# Patient Record
Sex: Female | Born: 1945 | Race: Black or African American | Hispanic: No | Marital: Single | Smoking: Former smoker
Health system: Southern US, Community
[De-identification: ages and names within clinical notes are randomized; demographics above are authoritative.]

## PROBLEM LIST (undated history)

## (undated) DIAGNOSIS — Z9221 Personal history of antineoplastic chemotherapy: Secondary | ICD-10-CM

## (undated) DIAGNOSIS — C801 Malignant (primary) neoplasm, unspecified: Secondary | ICD-10-CM

## (undated) DIAGNOSIS — I10 Essential (primary) hypertension: Secondary | ICD-10-CM

## (undated) DIAGNOSIS — Z923 Personal history of irradiation: Secondary | ICD-10-CM

## (undated) HISTORY — PX: MASTECTOMY: SHX3

## (undated) HISTORY — PX: ABDOMINAL HYSTERECTOMY: SHX81

---

## 2004-11-06 ENCOUNTER — Other Ambulatory Visit: Payer: Self-pay

## 2004-11-12 ENCOUNTER — Ambulatory Visit: Payer: Self-pay | Admitting: General Surgery

## 2004-12-02 ENCOUNTER — Ambulatory Visit: Payer: Self-pay | Admitting: Internal Medicine

## 2004-12-17 ENCOUNTER — Ambulatory Visit: Payer: Self-pay | Admitting: General Surgery

## 2004-12-21 ENCOUNTER — Ambulatory Visit: Payer: Self-pay | Admitting: Internal Medicine

## 2005-01-20 ENCOUNTER — Ambulatory Visit: Payer: Self-pay | Admitting: Internal Medicine

## 2005-02-20 ENCOUNTER — Ambulatory Visit: Payer: Self-pay | Admitting: Internal Medicine

## 2005-03-22 ENCOUNTER — Ambulatory Visit: Payer: Self-pay | Admitting: Internal Medicine

## 2005-04-22 ENCOUNTER — Ambulatory Visit: Payer: Self-pay | Admitting: Internal Medicine

## 2005-05-23 ENCOUNTER — Ambulatory Visit: Payer: Self-pay | Admitting: Internal Medicine

## 2005-06-22 ENCOUNTER — Ambulatory Visit: Payer: Self-pay | Admitting: Internal Medicine

## 2005-07-23 ENCOUNTER — Ambulatory Visit: Payer: Self-pay | Admitting: Internal Medicine

## 2005-08-01 ENCOUNTER — Ambulatory Visit: Payer: Self-pay | Admitting: General Surgery

## 2005-08-22 ENCOUNTER — Ambulatory Visit: Payer: Self-pay | Admitting: Internal Medicine

## 2005-09-22 ENCOUNTER — Ambulatory Visit: Payer: Self-pay | Admitting: Internal Medicine

## 2005-10-23 ENCOUNTER — Ambulatory Visit: Payer: Self-pay | Admitting: Internal Medicine

## 2005-11-20 ENCOUNTER — Ambulatory Visit: Payer: Self-pay | Admitting: Internal Medicine

## 2006-02-19 ENCOUNTER — Ambulatory Visit: Payer: Self-pay | Admitting: Internal Medicine

## 2006-02-20 ENCOUNTER — Ambulatory Visit: Payer: Self-pay | Admitting: Internal Medicine

## 2006-03-22 ENCOUNTER — Ambulatory Visit: Payer: Self-pay | Admitting: Internal Medicine

## 2006-03-30 ENCOUNTER — Ambulatory Visit: Payer: Self-pay | Admitting: Internal Medicine

## 2006-04-22 ENCOUNTER — Ambulatory Visit: Payer: Self-pay | Admitting: Internal Medicine

## 2006-05-23 ENCOUNTER — Ambulatory Visit: Payer: Self-pay | Admitting: Internal Medicine

## 2006-06-23 ENCOUNTER — Ambulatory Visit: Payer: Self-pay | Admitting: Internal Medicine

## 2006-07-23 ENCOUNTER — Ambulatory Visit: Payer: Self-pay | Admitting: Internal Medicine

## 2006-08-26 ENCOUNTER — Emergency Department: Payer: Self-pay | Admitting: Emergency Medicine

## 2006-09-28 ENCOUNTER — Emergency Department: Payer: Self-pay | Admitting: Internal Medicine

## 2006-10-08 ENCOUNTER — Ambulatory Visit: Payer: Self-pay | Admitting: Family Medicine

## 2006-10-21 ENCOUNTER — Ambulatory Visit: Payer: Self-pay | Admitting: Internal Medicine

## 2006-10-23 ENCOUNTER — Ambulatory Visit: Payer: Self-pay | Admitting: Internal Medicine

## 2007-01-21 ENCOUNTER — Ambulatory Visit: Payer: Self-pay | Admitting: Internal Medicine

## 2007-02-16 ENCOUNTER — Ambulatory Visit: Payer: Self-pay | Admitting: Internal Medicine

## 2007-02-21 ENCOUNTER — Ambulatory Visit: Payer: Self-pay | Admitting: Internal Medicine

## 2007-05-04 ENCOUNTER — Ambulatory Visit: Payer: Self-pay

## 2007-05-24 ENCOUNTER — Ambulatory Visit: Payer: Self-pay | Admitting: Internal Medicine

## 2007-06-16 ENCOUNTER — Ambulatory Visit: Payer: Self-pay | Admitting: Internal Medicine

## 2007-06-23 ENCOUNTER — Ambulatory Visit: Payer: Self-pay | Admitting: Internal Medicine

## 2007-09-23 ENCOUNTER — Ambulatory Visit: Payer: Self-pay | Admitting: Internal Medicine

## 2007-10-07 ENCOUNTER — Ambulatory Visit: Payer: Self-pay | Admitting: Internal Medicine

## 2007-10-19 ENCOUNTER — Ambulatory Visit: Payer: Self-pay | Admitting: Family Medicine

## 2007-10-24 ENCOUNTER — Ambulatory Visit: Payer: Self-pay | Admitting: Internal Medicine

## 2007-12-11 ENCOUNTER — Emergency Department: Payer: Self-pay | Admitting: Emergency Medicine
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
## 2008-03-22 ENCOUNTER — Ambulatory Visit: Payer: Self-pay | Admitting: Internal Medicine

## 2008-04-22 ENCOUNTER — Ambulatory Visit: Payer: Self-pay | Admitting: Internal Medicine

## 2008-08-01 ENCOUNTER — Ambulatory Visit: Payer: Self-pay

## 2008-09-22 ENCOUNTER — Ambulatory Visit: Payer: Self-pay | Admitting: Internal Medicine

## 2008-09-28 ENCOUNTER — Ambulatory Visit: Payer: Self-pay

## 2008-10-23 ENCOUNTER — Ambulatory Visit: Payer: Self-pay | Admitting: Internal Medicine

## 2008-12-21 ENCOUNTER — Ambulatory Visit: Payer: Self-pay | Admitting: Internal Medicine

## 2009-01-20 ENCOUNTER — Ambulatory Visit: Payer: Self-pay | Admitting: Internal Medicine

## 2009-03-19 ENCOUNTER — Ambulatory Visit: Payer: Self-pay | Admitting: Internal Medicine

## 2009-03-19 ENCOUNTER — Ambulatory Visit: Payer: Self-pay

## 2009-03-22 ENCOUNTER — Ambulatory Visit: Payer: Self-pay | Admitting: Internal Medicine

## 2009-03-22 ENCOUNTER — Ambulatory Visit: Payer: Self-pay

## 2009-04-05 ENCOUNTER — Ambulatory Visit: Payer: Self-pay | Admitting: General Surgery

## 2009-04-09 ENCOUNTER — Ambulatory Visit: Payer: Self-pay | Admitting: General Surgery

## 2009-04-22 ENCOUNTER — Ambulatory Visit: Payer: Self-pay | Admitting: Internal Medicine

## 2009-05-08 ENCOUNTER — Ambulatory Visit: Payer: Self-pay | Admitting: General Surgery

## 2009-05-17 ENCOUNTER — Ambulatory Visit: Payer: Self-pay | Admitting: Internal Medicine

## 2009-05-20 ENCOUNTER — Emergency Department: Payer: Self-pay | Admitting: Emergency Medicine

## 2009-05-23 ENCOUNTER — Ambulatory Visit: Payer: Self-pay | Admitting: Internal Medicine

## 2009-06-22 ENCOUNTER — Ambulatory Visit: Payer: Self-pay | Admitting: Internal Medicine

## 2009-07-23 ENCOUNTER — Ambulatory Visit: Payer: Self-pay | Admitting: Internal Medicine

## 2009-08-22 ENCOUNTER — Ambulatory Visit: Payer: Self-pay | Admitting: Internal Medicine

## 2009-09-22 ENCOUNTER — Ambulatory Visit: Payer: Self-pay | Admitting: Internal Medicine

## 2009-10-23 ENCOUNTER — Ambulatory Visit: Payer: Self-pay | Admitting: Internal Medicine

## 2009-11-20 ENCOUNTER — Ambulatory Visit: Payer: Self-pay | Admitting: Internal Medicine

## 2009-12-21 ENCOUNTER — Ambulatory Visit: Payer: Self-pay | Admitting: Internal Medicine

## 2010-02-20 ENCOUNTER — Emergency Department: Payer: Self-pay | Admitting: Emergency Medicine

## 2010-03-22 ENCOUNTER — Ambulatory Visit: Payer: Self-pay | Admitting: Internal Medicine

## 2010-03-28 ENCOUNTER — Ambulatory Visit: Payer: Self-pay | Admitting: Internal Medicine

## 2010-04-22 ENCOUNTER — Ambulatory Visit: Payer: Self-pay | Admitting: Internal Medicine

## 2010-06-22 ENCOUNTER — Emergency Department: Payer: Self-pay | Admitting: Emergency Medicine

## 2010-07-19 ENCOUNTER — Ambulatory Visit: Payer: Self-pay | Admitting: Internal Medicine

## 2010-07-23 ENCOUNTER — Ambulatory Visit: Payer: Self-pay | Admitting: Internal Medicine

## 2010-08-22 ENCOUNTER — Ambulatory Visit: Payer: Self-pay | Admitting: Internal Medicine

## 2014-04-17 ENCOUNTER — Ambulatory Visit: Payer: Self-pay

## 2014-10-31 ENCOUNTER — Ambulatory Visit: Payer: Self-pay | Admitting: Family Medicine

## 2015-09-18 ENCOUNTER — Ambulatory Visit
Admission: EM | Admit: 2015-09-18 | Discharge: 2015-09-18 | Disposition: A | Discharge status: Home / Self Care | Payer: Medicare HMO | Attending: Family Medicine | Admitting: Family Medicine

## 2015-09-18 ENCOUNTER — Ambulatory Visit (INDEPENDENT_AMBULATORY_CARE_PROVIDER_SITE_OTHER): Payer: Medicare HMO

## 2015-09-18 ENCOUNTER — Encounter: Payer: Self-pay | Admitting: Emergency Medicine

## 2015-09-18 DIAGNOSIS — R059 Cough, unspecified: Secondary | ICD-10-CM

## 2015-09-18 DIAGNOSIS — J189 Pneumonia, unspecified organism: Secondary | ICD-10-CM

## 2015-09-18 DIAGNOSIS — R05 Cough: Secondary | ICD-10-CM

## 2015-09-18 DIAGNOSIS — J90 Pleural effusion, not elsewhere classified: Secondary | ICD-10-CM | POA: Diagnosis not present

## 2015-09-18 DIAGNOSIS — R9389 Abnormal findings on diagnostic imaging of other specified body structures: Secondary | ICD-10-CM

## 2015-09-18 DIAGNOSIS — R938 Abnormal findings on diagnostic imaging of other specified body structures: Secondary | ICD-10-CM | POA: Diagnosis not present

## 2015-09-18 HISTORY — DX: Malignant (primary) neoplasm, unspecified: C80.1

## 2015-09-18 HISTORY — DX: Personal history of antineoplastic chemotherapy: Z92.21

## 2015-09-18 HISTORY — DX: Personal history of irradiation: Z92.3

## 2015-09-18 MED ORDER — HYDROCOD POLST-CPM POLST ER 10-8 MG/5ML PO SUER: 5.0000 mL | Freq: Two times a day (BID) | ORAL | Status: DC | PRN

## 2015-09-18 MED ORDER — LEVOFLOXACIN 500 MG PO TABS: 500.0000 mg | ORAL_TABLET | Freq: Every day | ORAL | Status: DC

## 2015-09-18 MED ORDER — ALBUTEROL SULFATE HFA 108 (90 BASE) MCG/ACT IN AERS: 2.0000 | INHALATION_SPRAY | RESPIRATORY_TRACT | Status: DC | PRN

## 2015-09-18 NOTE — Discharge Instructions (Signed)
Community-Acquired Pneumonia, Adult Pneumonia is an infection of the lungs. One type of pneumonia can happen while a person is in a hospital. A different type can happen when a person is not in a hospital (community-acquired pneumonia). It is easy for this kind to spread from person to person. It can spread to you if you breathe near an infected person who coughs or sneezes. Some symptoms include:  A dry cough.  A wet (productive) cough.  Fever.  Sweating.  Chest pain. HOME CARE  Take over-the-counter and prescription medicines only as told by your doctor.  Only take cough medicine if you are losing sleep.  If you were prescribed an antibiotic medicine, take it as told by your doctor. Do not stop taking the antibiotic even if you start to feel better.  Sleep with your head and neck raised (elevated). You can do this by putting a few pillows under your head, or you can sleep in a recliner.  Do not use tobacco products. These include cigarettes, chewing tobacco, and e-cigarettes. If you need help quitting, ask your doctor.  Drink enough water to keep your pee (urine) clear or pale yellow. A shot (vaccine) can help prevent pneumonia. Shots are often suggested for:  People older than 71 years of age.  People older than 71 years of age:  Who are having cancer treatment.  Who have long-term (chronic) lung disease.  Who have problems with their body's defense system (immune system). You may also prevent pneumonia if you take these actions:  Get the flu (influenza) shot every year.  Go to the dentist as often as told.  Wash your hands often. If soap and water are not available, use hand sanitizer. GET HELP IF:  You have a fever.  You lose sleep because your cough medicine does not help. GET HELP RIGHT AWAY IF:  You are short of breath and it gets worse.  You have more chest pain.  Your sickness gets worse. This is very serious if:  You are an older adult.  Your  body's defense system is weak.  You cough up blood.   This information is not intended to replace advice given to you by your health care provider. Make sure you discuss any questions you have with your health care provider.   Document Released: 02/25/2008 Document Revised: 05/30/2015 Document Reviewed: 01/03/2015 Elsevier Interactive Patient Education 2016 [employer]  Cough, Adult A cough helps to clear your throat and lungs. A cough may last only 2-3 weeks (acute), or it may last longer than 8 weeks (chronic). Many different things can cause a cough. A cough may be a sign of an illness or another medical condition. HOME CARE  Pay attention to any changes in your cough.  Take medicines only as told by your doctor.  If you were prescribed an antibiotic medicine, take it as told by your doctor. Do not stop taking it even if you start to feel better.  Talk with your doctor before you try using a cough medicine.  Drink enough fluid to keep your pee (urine) clear or pale yellow.  If the air is dry, use a cold steam vaporizer or humidifier in your home.  Stay away from things that make you cough at work or at home.  If your cough is worse at night, try using extra pillows to raise your head up higher while you sleep.  Do not smoke, and try not to be around smoke. If you need help quitting, ask  your doctor.  Do not have caffeine.  Do not drink alcohol.  Rest as needed. GET HELP IF:  You have new problems (symptoms).  You cough up yellow fluid (pus).  Your cough does not get better after 2-3 weeks, or your cough gets worse.  Medicine does not help your cough and you are not sleeping well.  You have pain that gets worse or pain that is not helped with medicine.  You have a fever.  You are losing weight and you do not know why.  You have night sweats. GET HELP RIGHT AWAY IF:  You cough up blood.  You have trouble breathing.  Your heartbeat is very fast.   This  information is not intended to replace advice given to you by your health care provider. Make sure you discuss any questions you have with your health care provider.   Document Released: 05/22/2011 Document Revised: 05/30/2015 Document Reviewed: 11/15/2014 Elsevier Interactive Patient Education 2016 [employer]  Pleural Effusion A pleural effusion is an abnormal buildup of fluid in the layers of tissue between your lungs and the inside of your chest (pleural space). These two layers of tissue that line both your lungs and the inside of your chest are called pleura. Usually, there is no air in the space between the pleura, only a thin layer of fluid. If left untreated, a large amount of fluid can build up and cause the lung to collapse. A pleural effusion is usually caused by another disease that requires treatment. The two main types of pleural effusion are:  Transudative pleural effusion. This happens when fluid leaks into the pleural space because of a low protein count in your blood or high blood pressure in your vessels. Heart failure often causes this.  Exudative infusion. This occurs when fluid collects in the pleural space from blocked blood vessels or lymph vessels. Some lung diseases, injuries, and cancers can cause this type of effusion. CAUSES Pleural effusion can be caused by:  Heart failure.  A blood clot in the lung (pulmonary embolism).  Pneumonia.  Cancer.  Liver failure (cirrhosis).  Kidney disease.  Complications from surgery, such as from open heart surgery. SIGNS AND SYMPTOMS In some cases, pleural effusion may cause no symptoms. Symptoms can include:  Shortness of breath, especially when lying down.  Chest pain, often worse when taking a deep breath.  Fever.  Dry cough that is lasting (chronic).  Hiccups.  Rapid breathing. An underlying condition that is causing the pleural effusion (such as heart failure, pneumonia, blood clots, tuberculosis, or  cancer) may also cause additional symptoms. DIAGNOSIS Your health care provider may suspect pleural effusion based on your symptoms and medical history. Your health care provider will also do a physical exam and a chest X-ray. If the X-ray shows there is fluid in your chest, you may need to have this fluid removed using a needle (thoracentesis) so it can be tested. You may also have:  Imaging studies of the chest, such as:  Ultrasound.  CT scan.  Blood tests for kidney and liver function. TREATMENT Treatment depends on the cause of the pleural effusion. Treatment may include:  Taking antibiotic medicines to clear up an infection that is causing the pleural effusion.  Placing a tube in the chest to drain the effusion (tube thoracostomy). This procedure is often used when there is an infection in the fluid.  Surgery to remove the fibrous outer layer of tissue from the pleural space (decortication).  Thoracentesis, which can  improve cough and shortness of breath.  A procedure to put medicine into the chest cavity to seal the pleural space to prevent fluid buildup (pleurodesis).  Chemotherapy and radiation therapy. These may be required in the case of cancerous (malignant) pleural effusion. HOME CARE INSTRUCTIONS  Take medicines only as directed by your health care provider.  Keep track of how long you can gently exercise before you get short of breath. Try simply walking at first.  Do not use any tobacco products, including cigarettes, chewing tobacco, or electronic cigarettes. If you need help quitting, ask your health care provider.  Keep all follow-up visits as directed by your health care provider. This is important. SEEK MEDICAL CARE IF:  The amount of time that you are able to exercise decreases or does not improve with time.  You have pain or signs of infection at the puncture site if you had thoracentesis. Watch for:  Drainage.  Redness.  Swelling.  You have a  fever. SEEK IMMEDIATE MEDICAL CARE IF:  You are short of breath.  You develop chest pain.  You develop a new cough. MAKE SURE YOU:  Understand these instructions.  Will watch your condition.  Will get help right away if you are not doing well or get worse.   This information is not intended to replace advice given to you by your health care provider. Make sure you discuss any questions you have with your health care provider.   Document Released: 09/08/2005 Document Revised: 09/29/2014 Document Reviewed: 02/01/2014 Elsevier Interactive Patient Education [employer].

## 2015-09-18 NOTE — ED Notes (Signed)
Pt reports cough, chest pain when coughs only, no CP now, chills, nasal congestion.

## 2015-09-18 NOTE — ED Provider Notes (Signed)
CSN: [id number]     Arrival date & time 09/18/15  1431 History   First MD Initiated Contact with Patient 09/18/15 1621    Nurses notes were reviewed. Chief Complaint  Patient presents with  . Cough  . Chills   Patient reports Christmas Day for about 3 days ago she became sick coughing fever and hurts when she takes a deep breath when she coughs. She denies any other significant medical problems she had hypertension but took off her blood pressure medicine several years ago. She had mastectomy of left breast because of breast cancer but that was also several years ago and she's had a hysterectomy. She denies any medications at this time.  She's had a history of some bronchospastic disease before in the past and has views multiple inhalers for that as well. She is a been told that she had an abnormal chest x-ray or any pleural effusion.  (Consider location/radiation/quality/duration/timing/severity/associated sxs/prior Treatment) Patient is a 69 y.o. female presenting with cough. The history is provided by the patient. No language interpreter was used.  Cough Cough characteristics:  Non-productive Severity:  Moderate Duration:  3 days Timing:  Constant Progression:  Worsening Chronicity:  New Context: not animal exposure, not exposure to allergens, not fumes, not occupational exposure, not sick contacts, not smoke exposure, not upper respiratory infection and not with activity   Relieved by:  Nothing Ineffective treatments:  None tried Associated symptoms: chest pain, fever and wheezing   Associated symptoms: no diaphoresis, no ear pain, no rash, no shortness of breath, no sinus congestion and no sore throat     Past Medical History  Diagnosis Date  . Cancer ([place])   . History of chemotherapy   . History of radiation therapy    Past Surgical History  Procedure Laterality Date  . Abdominal hysterectomy    . Mastectomy Left    History reviewed. No pertinent family history. Social  History  Substance Use Topics  . Smoking status: Former Research scientist (life sciences)  . Smokeless tobacco: None  . Alcohol Use: No   OB History    No data available     Review of Systems  Constitutional: Positive for fever. Negative for diaphoresis.  HENT: Negative for ear pain and sore throat.   Respiratory: Positive for cough and wheezing. Negative for shortness of breath.   Cardiovascular: Positive for chest pain.  Skin: Negative for rash.    Allergies  Review of patient's allergies indicates no known allergies.  Home Medications   Prior to Admission medications   Medication Sig Start Date End Date Taking? Authorizing Provider  albuterol (PROVENTIL HFA;VENTOLIN HFA) 108 (90 Base) MCG/ACT inhaler Inhale 2 puffs into the lungs every 4 (four) hours as needed for wheezing or shortness of breath. 09/18/15   Frederich Cha, MD  chlorpheniramine-HYDROcodone [facility]) 10-8 MG/5ML SUER Take 5 mLs by mouth every 12 (twelve) hours as needed for cough. 09/18/15   Frederich Cha, MD  levofloxacin (LEVAQUIN) 500 MG tablet Take 1 tablet (500 mg total) by mouth daily. 09/18/15   Frederich Cha, MD   Meds Ordered and Administered this Visit  Medications - No data to display  BP 134/59 mmHg  Pulse 84  Temp(Src) 100.3 F (37.9 C) (Oral)  Resp 18  Ht 5' 7.5" (1.715 m)  Wt 180 lb (81.647 kg)  BMI 27.76 kg/m2  SpO2 97% No data found.   Physical Exam  ED Course  Procedures (including critical care time)  Labs Review Labs Reviewed - No data  to display  Imaging Review Dg Chest 2 View  09/18/2015  CLINICAL DATA:  Acute cough and chest pain. EXAM: CHEST  2 VIEW COMPARISON:  May 08, 2009. FINDINGS: The heart size and mediastinal contours are within normal limits. Both lungs are clear. No pneumothorax is noted. Minimal bilateral pleural effusions may be present. Surgical clips are noted in left axillary region. The visualized skeletal structures are unremarkable. IMPRESSION: Minimal bilateral  pleural effusions are noted. No other significant abnormality seen in the chest. Electronically Signed   By: Marijo Conception, M.D.   On: 09/18/2015 16:22     Visual Acuity Review  Right Eye Distance:   Left Eye Distance:   Bilateral Distance:    Right Eye Near:   Left Eye Near:    Bilateral Near:         MDM   1. Community acquired pneumonia   2. Bilateral pleural effusion   3. Cough   4. Abnormal chest x-ray    Patient was informed that think she has pneumonia due to the low pleural effusion on both sides the lungs. She has had a low-grade fever fever in the 100s and she is coughing and pain on both sides the chest when she coughs. Strongly suggest that she follow-up with her PCP in 3 weeks when she follow-up chest x-ray and possible CT scan of the lungs to make sure that everything is resolved. Because pulse ox is 97 offered breathing treatment she declined will give her a prescription for albuterol inhaler she's used that before the past he motion flexion is now just in case proximal spasm or shortness of breath does occur. We'll place on Tussionex 1 teaspoon twice a day since coughing does hurt and because of her concern of community-acquired pneumonia Levaquin 500 mg 1 tablet day for 10 days.    Frederich Cha, MD 09/18/15 1739

## 2015-11-18 ENCOUNTER — Emergency Department
Admission: EM | Admit: 2015-11-18 | Discharge: 2015-11-18 | Disposition: A | Discharge status: Home / Self Care | Payer: Commercial Managed Care - HMO | Attending: Emergency Medicine | Admitting: Emergency Medicine

## 2015-11-18 ENCOUNTER — Emergency Department: Payer: Commercial Managed Care - HMO

## 2015-11-18 DIAGNOSIS — R1310 Dysphagia, unspecified: Secondary | ICD-10-CM | POA: Diagnosis not present

## 2015-11-18 DIAGNOSIS — R0989 Other specified symptoms and signs involving the circulatory and respiratory systems: Secondary | ICD-10-CM | POA: Insufficient documentation

## 2015-11-18 DIAGNOSIS — Z87891 Personal history of nicotine dependence: Secondary | ICD-10-CM | POA: Diagnosis not present

## 2015-11-18 MED ORDER — GI COCKTAIL ~~LOC~~
30.0000 mL | Freq: Once | ORAL | Status: AC
  Administered 2015-11-18: 30 mL via ORAL
  Filled 2015-11-18 (×2): qty 30

## 2015-11-18 MED ORDER — LIDOCAINE VISCOUS 2 % MT SOLN: 20.0000 mL | OROMUCOSAL | Status: DC | PRN

## 2015-11-18 NOTE — Discharge Instructions (Signed)
Dysphagia Swallowing problems (dysphagia) occur when solids and liquids seem to stick in your throat on the way down to your stomach, or the food takes longer to get to the stomach. Other symptoms include regurgitating food, noises coming from the throat, chest discomfort with swallowing, and a feeling of fullness or the feeling of something being stuck in your throat when swallowing. When blockage in your throat is complete, it may be associated with drooling. CAUSES  Problems with swallowing may occur because of problems with the muscles. The food cannot be propelled in the usual manner into your stomach. You may have ulcers, scar tissue, or inflammation in the tube down which food travels from your mouth to your stomach (esophagus), which blocks food from passing normally into the stomach. Causes of inflammation include:  Acid reflux from your stomach into your esophagus.  Infection.  Recent food irritating the esophagus  Radiation treatment for cancer.  Medicines taken without enough fluids to wash them down into your stomach. You may have nerve problems that prevent signals from being sent to the muscles of your esophagus to contract and move your food down to your stomach. Globus pharyngeus is a relatively common problem in which there is a sense of an obstruction or difficulty in swallowing, without any physical abnormalities of the swallowing passages being found. This problem usually improves over time with reassurance and testing to rule out other causes. DIAGNOSIS Dysphagia can be diagnosed and its cause can be determined by tests in which you swallow a white substance that helps illuminate the inside of your throat (contrast medium) while X-rays are taken. Sometimes a flexible telescope that is inserted down your throat (endoscopy) to look at your esophagus and stomach is used. TREATMENT   If the dysphagia is caused by acid reflux or infection, medicines may be used.  If the  dysphagia is caused by problems with your swallowing muscles, swallowing therapy may be used to help you strengthen your swallowing muscles.  If the dysphagia is caused by a blockage or mass, procedures to remove the blockage may be done. HOME CARE INSTRUCTIONS  Try to eat soft food that is easier to swallow and check your weight on a daily basis to be sure that it is not decreasing.  Be sure to drink liquids when sitting upright (not lying down). SEEK MEDICAL CARE IF:  You are losing weight because you are unable to swallow.  You are coughing when you drink liquids (aspiration).  You are coughing up partially digested food. SEEK IMMEDIATE MEDICAL CARE IF:  You are unable to swallow your own saliva .  You are having shortness of breath or a fever, or both.  You have a hoarse voice along with difficulty swallowing. MAKE SURE YOU:  Understand these instructions.  Will watch your condition.  Will get help right away if you are not doing well or get worse.   This information is not intended to replace advice given to you by your health care provider. Make sure you discuss any questions you have with your health care provider.   Document Released: 09/05/2000 Document Revised: 09/29/2014 Document Reviewed: 02/25/2013 Elsevier Interactive Patient Education [employer].

## 2015-11-18 NOTE — ED Provider Notes (Signed)
[facility] Emergency Department Provider Note     Time seen: ----------------------------------------- 2:28 PM on 11/18/2015 -----------------------------------------    I have reviewed the triage vital signs and the nursing notes.   HISTORY  Chief Complaint Foreign Body    HPI Norma Snyder is a 70 y.o. female who presents ER for a foreign body sensation in her throat. Patient states she was eating white meat chicken yesterday and felt like she swallowed a bone. Every time she swallows she has pain, also pain when she touches her neck. She denies fevers chills or other complaints, states there were no bones in the meat that she was eating.   Past Medical History  Diagnosis Date  . Cancer ([place])   . History of chemotherapy   . History of radiation therapy     There are no active problems to display for this patient.   Past Surgical History  Procedure Laterality Date  . Abdominal hysterectomy    . Mastectomy Left     Allergies Review of patient's allergies indicates no known allergies.  Social History Social History  Substance Use Topics  . Smoking status: Former Research scientist (life sciences)  . Smokeless tobacco: Not on file  . Alcohol Use: No    Review of Systems Constitutional: Negative for fever. Eyes: Negative for visual changes. ENT: Positive for dysphagia Cardiovascular: Negative for chest pain. Respiratory: Negative for shortness of breath. Gastrointestinal: Negative for abdominal pain, vomiting and diarrhea. Genitourinary: Negative for dysuria. Musculoskeletal: Negative for back pain. Skin: Negative for rash. Neurological: Negative for headaches, focal weakness or numbness.  10-point ROS otherwise negative.  ____________________________________________   PHYSICAL EXAM:  VITAL SIGNS: ED Triage Vitals  Enc Vitals Group     BP 11/18/15 1335 155/76 mmHg     Pulse Rate 11/18/15 1335 99     Resp 11/18/15 1335 18     Temp 11/18/15 1335  99 F (37.2 C)     Temp Source 11/18/15 1335 Oral     SpO2 11/18/15 1335 98 %     Weight 11/18/15 1335 192 lb (87.091 kg)     Height 11/18/15 1335 5\' 7"  (1.702 m)     Head Cir --      Peak Flow --      Pain Score 11/18/15 1341 7     Pain Loc --      Pain Edu? --      Excl. in [place]? --     Constitutional: Alert and oriented. Well appearing and in no distress. Eyes: Conjunctivae are normal. PERRL. Normal extraocular movements. ENT   Head: Normocephalic and atraumatic.   Nose: No congestion/rhinnorhea.   Mouth/Throat: Mucous membranes are moist.   Neck: No stridor. No crepitus in the neck Cardiovascular: Normal rate, regular rhythm. Normal and symmetric distal pulses are present in all extremities. No murmurs, rubs, or gallops. Respiratory: Normal respiratory effort without tachypnea nor retractions. Breath sounds are clear and equal bilaterally. No wheezes/rales/rhonchi. Neurologic:  Normal speech and language. No gross focal neurologic deficits are appreciated. Speech is normal. No gait instability. Skin:  Skin is warm, dry and intact. No rash noted. ____________________________________________  ED COURSE:  Pertinent labs & imaging results that were available during my care of the patient were reviewed by me and considered in my medical decision making (see chart for details).  patient's no acute distress, likely foreign body sensation. I will obtain soft tissue neck and reevaluate.  ____________________________________________  RADIOLOGY Images were viewed by me  Soft tissue  neck IMPRESSION: No acute abnormalities or radiopaque foreign body.  Degenerative changes in the cervical spine. ___________________________________________  FINAL ASSESSMENT AND PLAN    foreign body sensation   Plan: Patient with imaging as dictated above. Patient foreign body sensation, nothing visible on x-ray. She'll be discharged with viscous lidocaine and referred to ENT for  follow-up if her symptoms persist.   Earleen Newport, MD   Earleen Newport, MD 11/18/15 1440

## 2015-11-18 NOTE — ED Notes (Signed)
NAD noted at time of D/C. Pt denies questions or concerns. Pt ambulatory to the lobby at this time.  

## 2015-11-18 NOTE — ED Notes (Addendum)
Pt states she was eating chicken yesterday and since has felt like she had a bone in her throat. Pt states she tried eating bread to and it is not moving the bone? States it feels like a sharp pain that increases when she eats or when she touches neck. No distress noted.

## 2015-11-20 ENCOUNTER — Other Ambulatory Visit: Payer: Self-pay | Admitting: Otolaryngology

## 2015-11-20 DIAGNOSIS — R1310 Dysphagia, unspecified: Secondary | ICD-10-CM

## 2015-11-22 ENCOUNTER — Ambulatory Visit
Admission: RE | Admit: 2015-11-22 | Discharge: 2015-11-22 | Disposition: A | Discharge status: Home / Self Care | Payer: Commercial Managed Care - HMO | Source: Ambulatory Visit | Attending: Otolaryngology | Admitting: Otolaryngology

## 2015-11-22 DIAGNOSIS — R1310 Dysphagia, unspecified: Secondary | ICD-10-CM

## 2016-12-10 IMAGING — CR DG CHEST 2V
2 series · 2 of 2 positions shown · non-contrast
Comparison: May 08, 2009.

CLINICAL DATA: Acute cough and chest pain.

EXAM:
CHEST  2 VIEW

[chest pa]
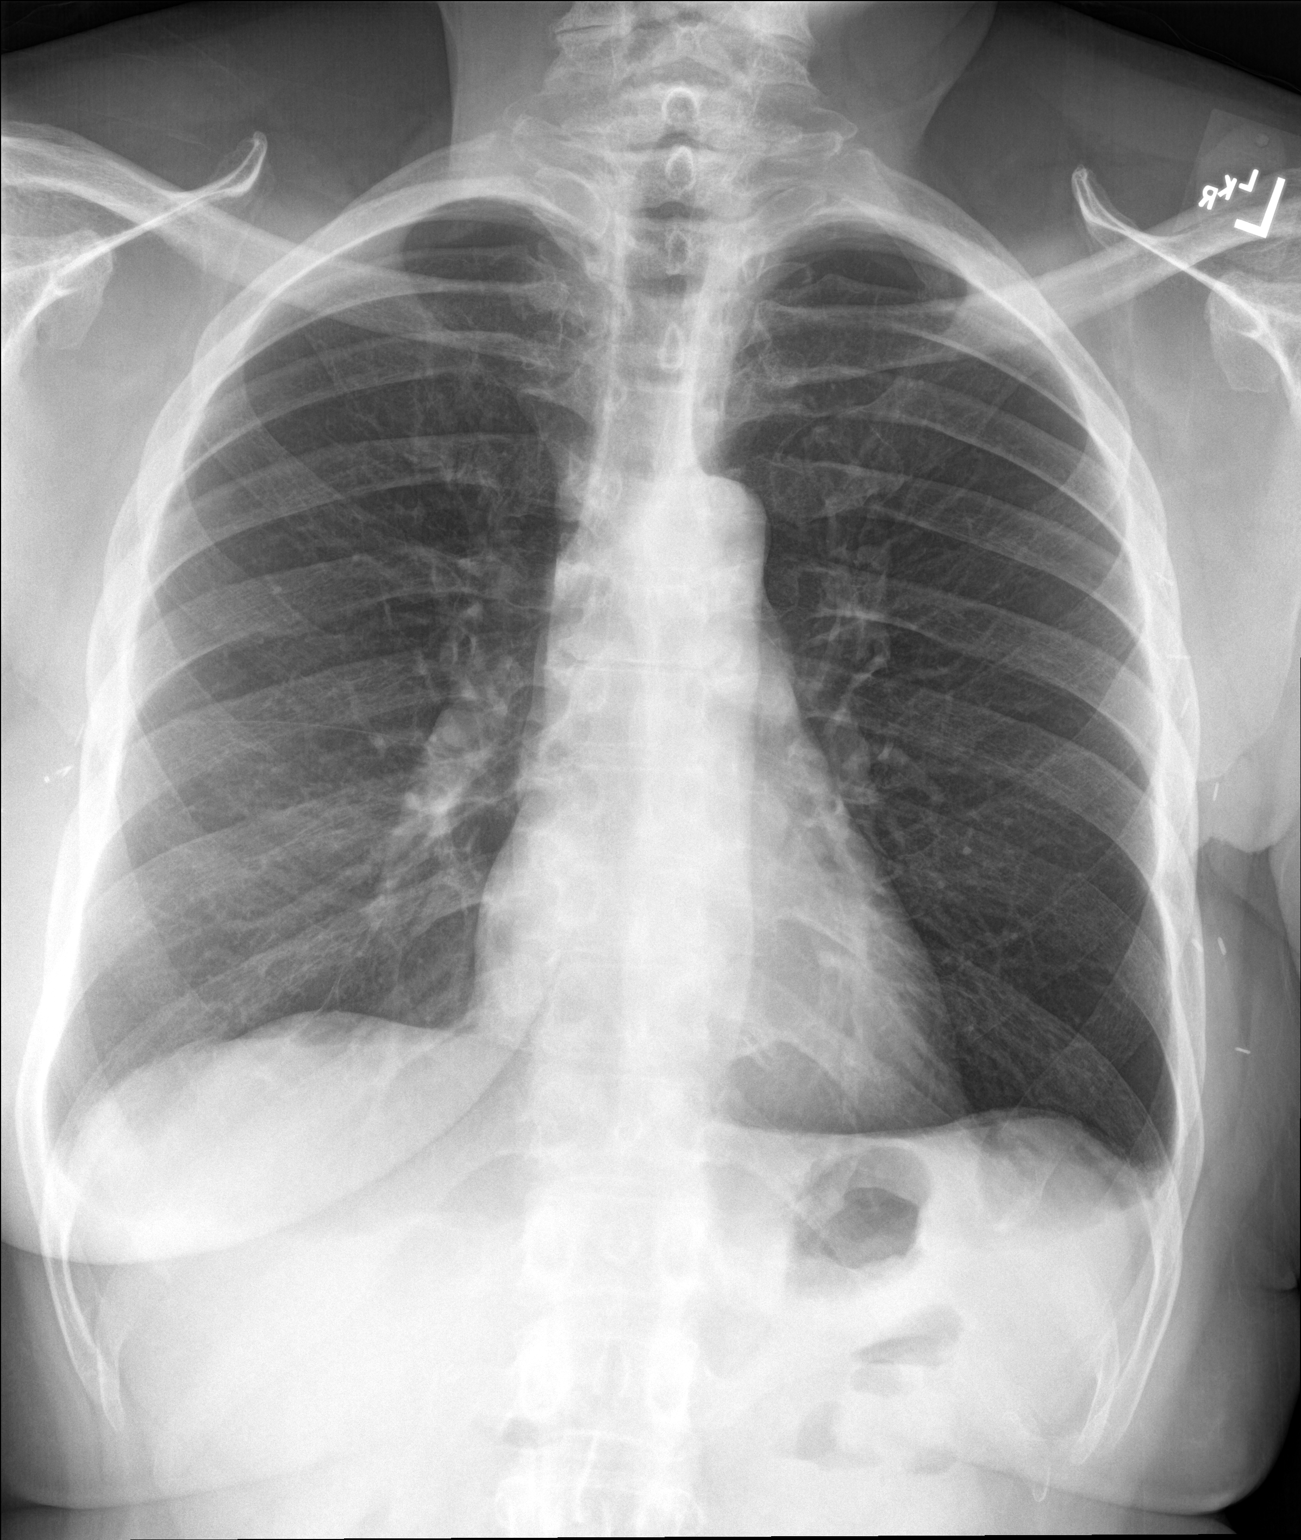

[chest lat]
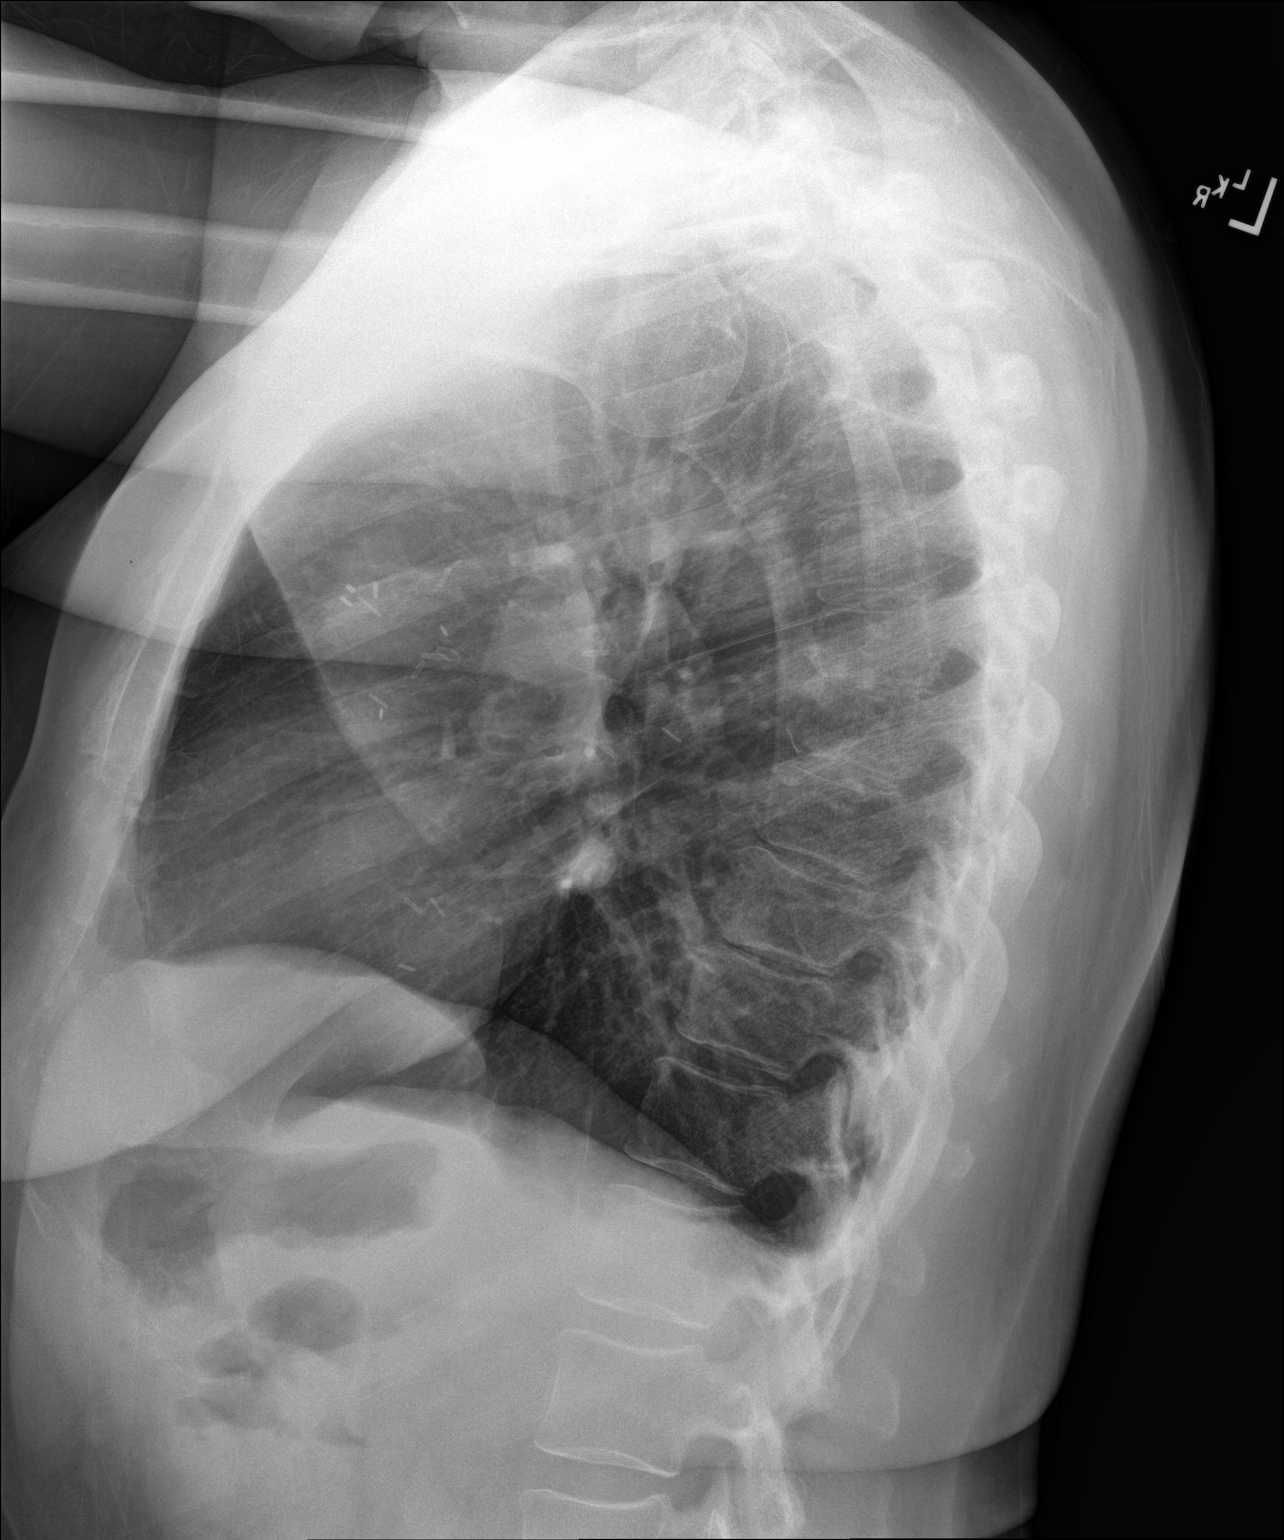

[2 of 2 positions shown; findings below may reference images not displayed]

FINDINGS: The heart size and mediastinal contours are within normal limits.
Both lungs are clear. No pneumothorax is noted. Minimal bilateral
pleural effusions may be present. Surgical clips are noted in left
axillary region. The visualized skeletal structures are
unremarkable.
IMPRESSION: Minimal bilateral pleural effusions are noted. No other significant
abnormality seen in the chest.

## 2017-02-09 IMAGING — CR DG NECK SOFT TISSUE
2 series · 2 of 2 positions shown · non-contrast
Comparison: None.

CLINICAL DATA: 69-year-old female with throat pain after eating
chicken yesterday. Sensation of bone in throat.

EXAM:
NECK SOFT TISSUES - 1+ VIEW

[neck lat]
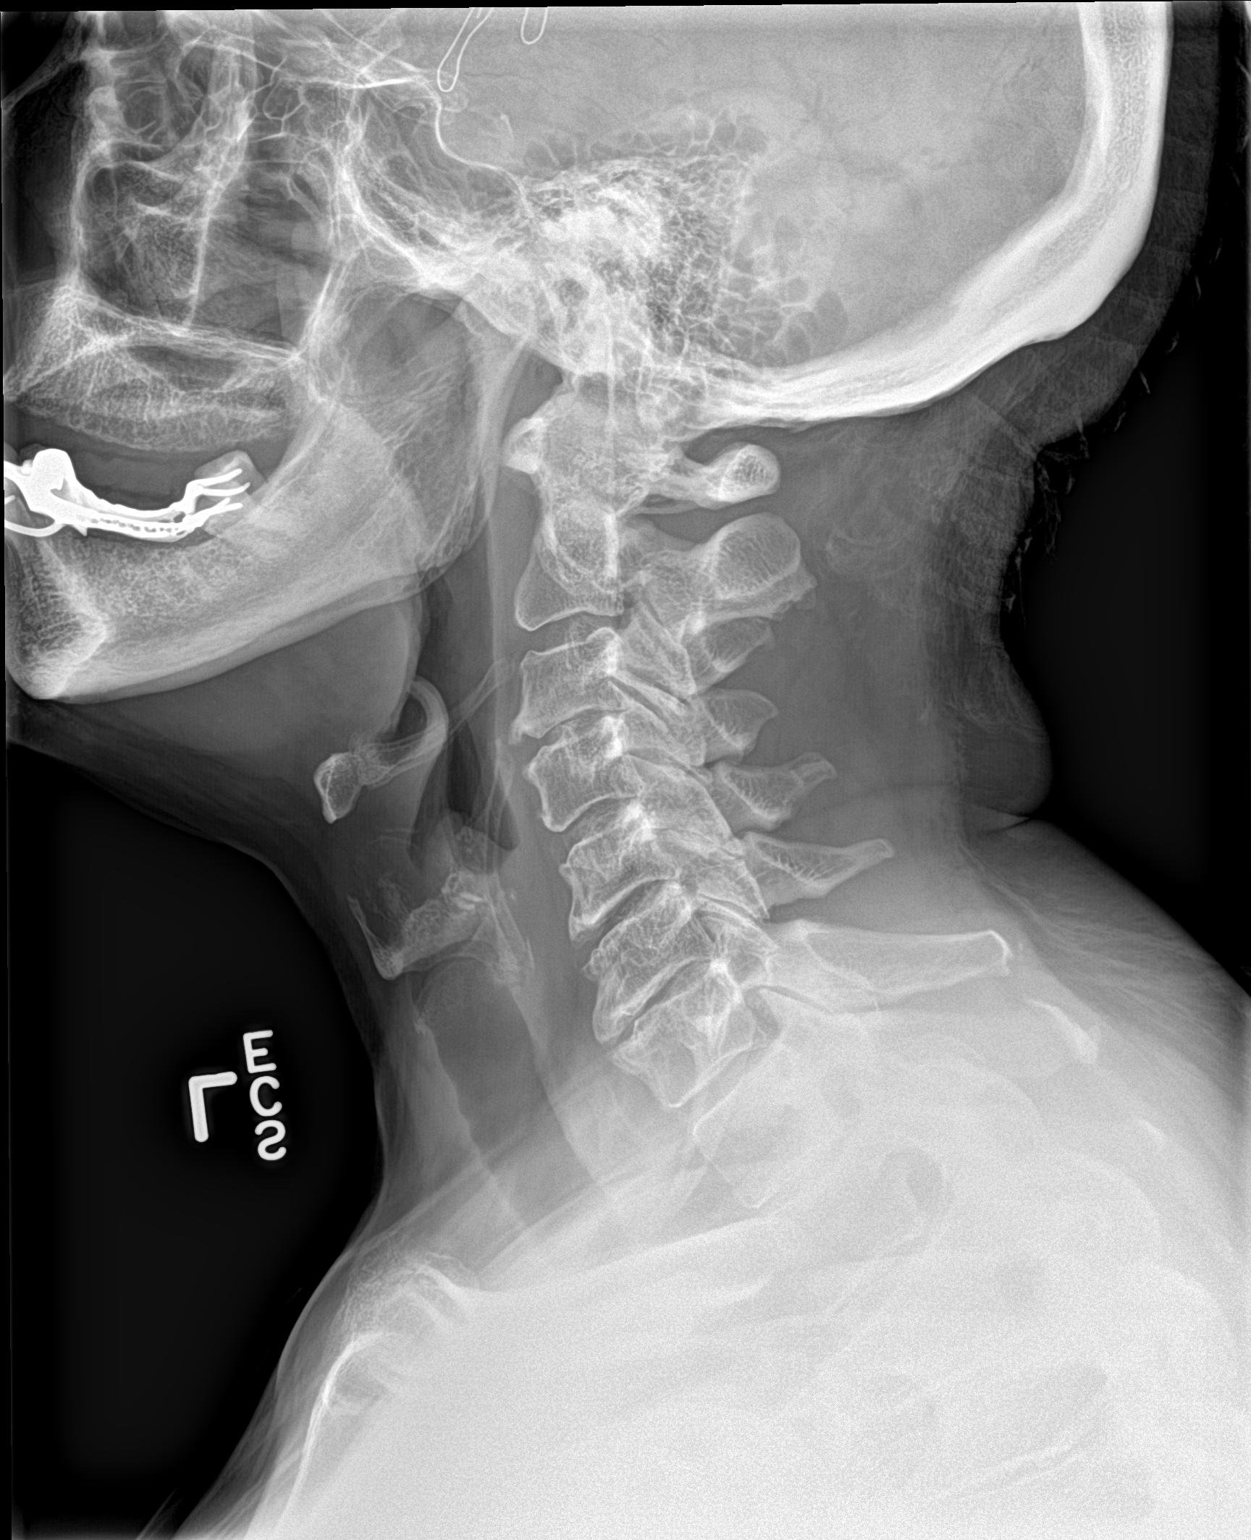

[neck ap]
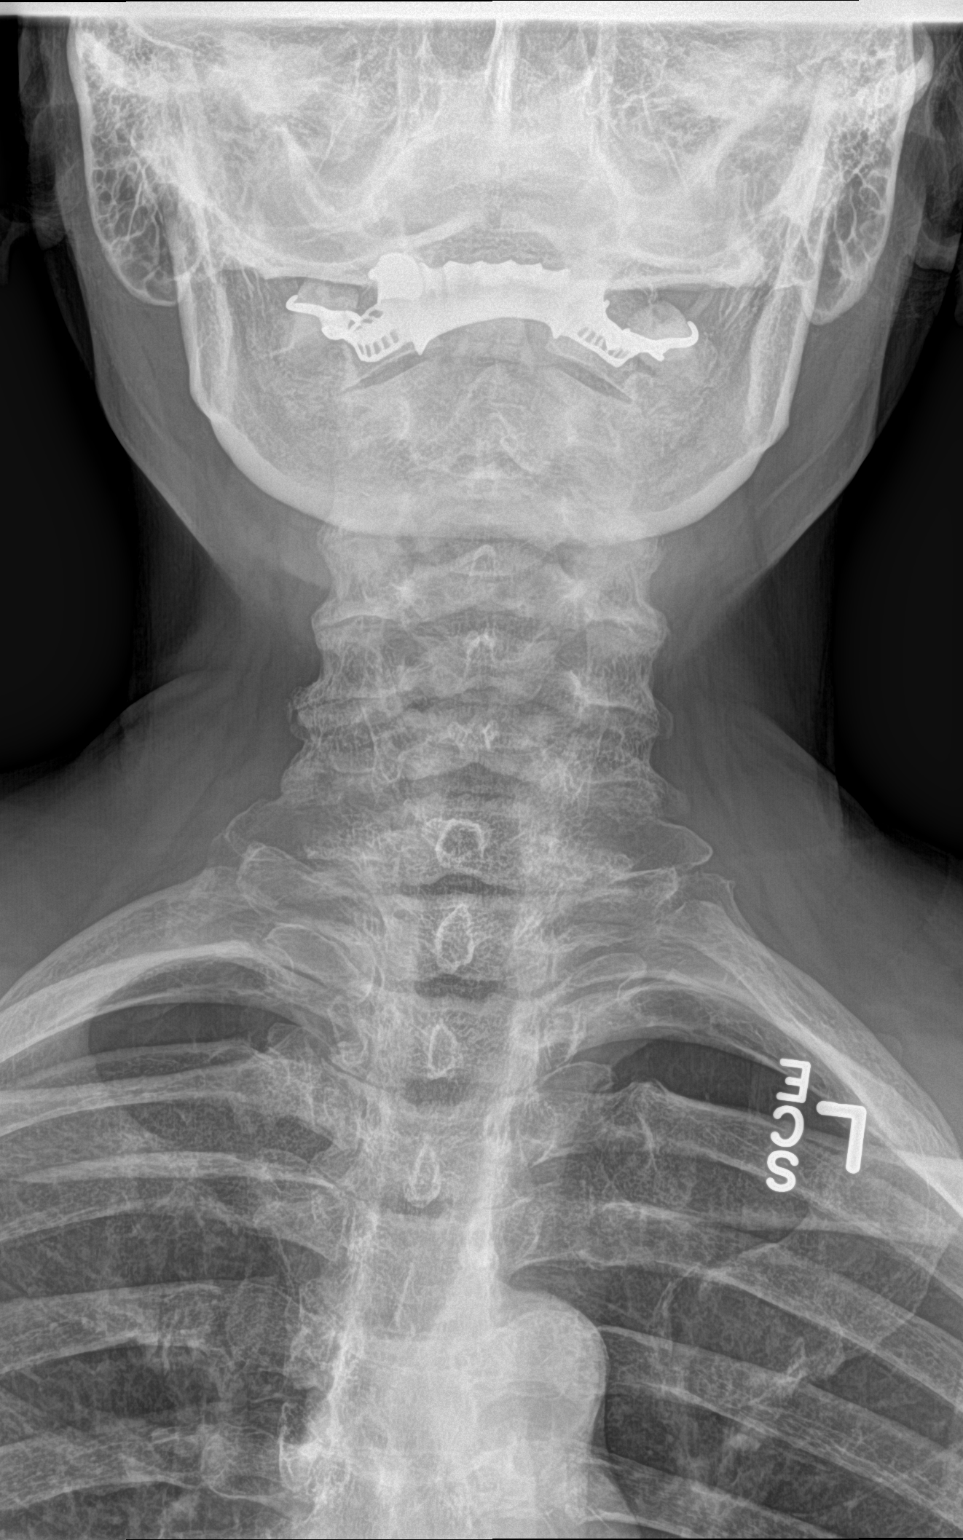

[2 of 2 positions shown; findings below may reference images not displayed]

FINDINGS: There is no evidence of retropharyngeal soft tissue swelling or
epiglottic enlargement. The cervical airway is unremarkable and no
radio-opaque foreign body identified.

Degenerative changes within the cervical spine noted.
IMPRESSION: No acute abnormalities or radiopaque foreign body.

Degenerative changes in the cervical spine.

## 2017-03-12 ENCOUNTER — Ambulatory Visit
Admission: EM | Admit: 2017-03-12 | Discharge: 2017-03-12 | Disposition: A | Discharge status: Home / Self Care | Payer: Medicare HMO | Attending: Family Medicine | Admitting: Family Medicine

## 2017-03-12 DIAGNOSIS — Z111 Encounter for screening for respiratory tuberculosis: Secondary | ICD-10-CM | POA: Diagnosis not present

## 2017-03-12 DIAGNOSIS — J301 Allergic rhinitis due to pollen: Secondary | ICD-10-CM

## 2017-03-12 HISTORY — DX: Essential (primary) hypertension: I10

## 2017-03-12 MED ORDER — CETIRIZINE HCL 10 MG PO TABS: 10.0000 mg | ORAL_TABLET | Freq: Every day | ORAL | 6 refills | Status: DC

## 2017-03-12 MED ORDER — TUBERCULIN PPD 5 UNIT/0.1ML ID SOLN
5.0000 [IU] | Freq: Once | INTRADERMAL | Status: DC
  Administered 2017-03-12: 5 [IU] via INTRADERMAL

## 2017-03-12 MED ORDER — FLUTICASONE PROPIONATE 50 MCG/ACT NA SUSP: 2.0000 | Freq: Every day | NASAL | 0 refills | Status: DC

## 2017-03-12 NOTE — ED Provider Notes (Signed)
CSN: [id number]     Arrival date & time 03/12/17  1625 History   None    Chief Complaint  Patient presents with  . Sore Throat  . Allergies   (Consider location/radiation/quality/duration/timing/severity/associated sxs/prior Treatment) HPI  This is a 71 year old female who presents with complaints of cough which is nonproductive sore throat sneezing itchy and dry eyes for the past 3-4 days. He wakes up stuffy and congested. She has hoarseness when she is outside but this clears when she goes inside in air conditioning. He said no fever no chills no shortness of breath. She is going to return to work force very soon and needs a TB test.             Past Medical History:  Diagnosis Date  . Cancer ([place])   . History of chemotherapy   . History of radiation therapy   . Hypertension    Past Surgical History:  Procedure Laterality Date  . ABDOMINAL HYSTERECTOMY    . MASTECTOMY Left    History reviewed. No pertinent family history. Social History  Substance Use Topics  . Smoking status: Former Research scientist (life sciences)  . Smokeless tobacco: Former Systems developer  . Alcohol use No   OB History    No data available     Review of Systems  Constitutional: Positive for activity change. Negative for appetite change, chills, fatigue and fever.  HENT: Positive for congestion, postnasal drip, sinus pressure, sore throat and voice change.   Respiratory: Positive for cough. Negative for shortness of breath, wheezing and stridor.   All other systems reviewed and are negative.   Allergies  Patient has no known allergies.  Home Medications   Prior to Admission medications   Medication Sig Start Date End Date Taking? Authorizing Provider  aspirin 81 MG tablet Take 81 mg by mouth daily.   Yes [provider]  cetirizine (ZYRTEC ALLERGY) 10 MG tablet Take 1 tablet (10 mg total) by mouth daily. 03/12/17   Lorin Picket, PA-C  fluticasone (FLONASE) 50 MCG/ACT nasal spray Place 2 sprays into both  nostrils daily. 03/12/17   Lorin Picket, PA-C   Meds Ordered and Administered this Visit   Medications  tuberculin injection 5 Units (5 Units Intradermal Given 03/12/17 1743)    BP (!) 145/72 (BP Location: Left Arm)   Pulse 98   Temp 98.2 F (36.8 C) (Oral)   Resp 18   Ht 5\' 7"  (1.702 m)   Wt 190 lb (86.2 kg)   SpO2 98%   BMI 29.76 kg/m  No data found.   Physical Exam  Constitutional: She is oriented to person, place, and time. She appears well-developed and well-nourished. No distress.  HENT:  Head: Normocephalic.  Right Ear: External ear normal.  Left Ear: External ear normal.  Nose: Nose normal.  Mouth/Throat: Oropharynx is clear and moist. No oropharyngeal exudate.  Eyes: Pupils are equal, round, and reactive to light. Right eye exhibits no discharge. Left eye exhibits no discharge.  Neck: Normal range of motion. Neck supple.  Pulmonary/Chest: Effort normal and breath sounds normal.  Musculoskeletal: Normal range of motion.  Lymphadenopathy:    She has no cervical adenopathy.  Neurological: She is alert and oriented to person, place, and time.  Skin: Skin is warm and dry. She is not diaphoretic.  Psychiatric: She has a normal mood and affect. Her behavior is normal. Judgment and thought content normal.  Nursing note and vitals reviewed.   Urgent Care Course  Procedures (including critical care time)  Labs Review Labs Reviewed - No data to display  Imaging Review No results found.   Visual Acuity Review  Right Eye Distance:   Left Eye Distance:   Bilateral Distance:    Right Eye Near:   Left Eye Near:    Bilateral Near:         MDM   1. Seasonal allergic rhinitis due to pollen    New Prescriptions   CETIRIZINE (ZYRTEC ALLERGY) 10 MG TABLET    Take 1 tablet (10 mg total) by mouth daily.   FLUTICASONE (FLONASE) 50 MCG/ACT NASAL SPRAY    Place 2 sprays into both nostrils daily.  Plan: 1. Test/x-ray results and diagnosis reviewed with  patient 2. rx as per orders; risks, benefits, potential side effects reviewed with patient 3. Recommend supportive treatment with Nasonex nasal spray and Zyrtec on a daily basis. He should use this every spring and fall. If she is not improving she should follow-up with her primary care physician. 4. F/u prn if symptoms worsen or don't improve     Lorin Picket, PA-C 03/12/17 1747

## 2017-03-12 NOTE — ED Triage Notes (Signed)
A 71 year old [place] American woman presents today with complaints of cough - dry, sore throat, sneezing, itchy and dry eyes for the past 3-4 days. Patient states she does wake up stuffy and stopped up as well possibly due to the [facility]. Pt denies fever and SOB.

## 2017-03-15 ENCOUNTER — Ambulatory Visit
Admission: EM | Admit: 2017-03-15 | Discharge: 2017-03-15 | Disposition: A | Discharge status: Home / Self Care | Payer: Medicare HMO | Attending: Internal Medicine | Admitting: Internal Medicine

## 2017-03-15 NOTE — ED Notes (Signed)
Patient ppd read 0.66mm . Negative result.

## 2017-10-01 ENCOUNTER — Ambulatory Visit
Admission: EM | Admit: 2017-10-01 | Discharge: 2017-10-01 | Disposition: A | Discharge status: Home / Self Care | Payer: Medicare HMO | Attending: Family Medicine | Admitting: Family Medicine

## 2017-10-01 ENCOUNTER — Other Ambulatory Visit: Payer: Self-pay

## 2017-10-01 DIAGNOSIS — J0110 Acute frontal sinusitis, unspecified: Secondary | ICD-10-CM | POA: Diagnosis not present

## 2017-10-01 MED ORDER — AMOXICILLIN-POT CLAVULANATE 875-125 MG PO TABS: 1.0000 | ORAL_TABLET | Freq: Two times a day (BID) | ORAL | 0 refills | Status: DC

## 2017-10-01 MED ORDER — FLUTICASONE PROPIONATE 50 MCG/ACT NA SUSP: 1.0000 | Freq: Every day | NASAL | 0 refills | Status: DC

## 2017-10-01 NOTE — ED Triage Notes (Signed)
Patient reports that a few days while at work she felt a good amount of sinus pressure. Patient states that she has remained having sinus pressure since then.

## 2017-10-01 NOTE — ED Provider Notes (Addendum)
[facility] ____________________________________________  Time seen: Approximately 4:00 PM  I have reviewed the triage vital signs and the nursing notes.   HISTORY  Chief Complaint Sinusitis  HPI Norma Snyder is a 72 y.o. female presenting for evaluation of approximately 1 week of feeling congested in her head.  Patient states that over the last week she has been having sensation of clogged sinuses in her forehead and intermittent nasal congestion. States no headache, but states clogged and congested feeling. States that she is not able to really get a lot of nasal drainage out with blowing her nose. Also states today with somewhat States does also feel like she has some fluid in both of her ears.  States that she has had a lot of sick contacts recently including her great granddaughter and her sister, as well as she works at [employer].  Denies sore throat.  Reports continues to eat and drink well.  Denies fevers, chest pain, shortness of breath, hemoptysis, syncope, paresthesias, near syncope, dysuria or weakness. Denies recent sickness. Denies recent antibiotic use.  Denies renal insufficiency.  Junie Panning, MD: PCP   Past Medical History:  Diagnosis Date  . Cancer ([place])   . History of chemotherapy   . History of radiation therapy   . Hypertension     There are no active problems to display for this patient.   Past Surgical History:  Procedure Laterality Date  . ABDOMINAL HYSTERECTOMY    . MASTECTOMY Left      No current facility-administered medications for this encounter.   Current Outpatient Medications:  .  aspirin 81 MG tablet, Take 81 mg by mouth daily., Disp: , Rfl:  .  amoxicillin-clavulanate (AUGMENTIN) 875-125 MG tablet, Take 1 tablet by mouth every 12 (twelve) hours., Disp: 20 tablet, Rfl: 0 .  fluticasone (FLONASE) 50 MCG/ACT nasal spray, Place 1 spray into both nostrils daily., Disp: 1 g, Rfl: 0  Allergies Patient has no known  allergies.  History reviewed. No pertinent family history.  Social History Social History   Tobacco Use  . Smoking status: Former Research scientist (life sciences)  . Smokeless tobacco: Former Network engineer Use Topics  . Alcohol use: No  . Drug use: No    Review of Systems Constitutional: No fever/chills ENT: No sore throat. AS above.  Cardiovascular: Denies chest pain. Respiratory: Denies shortness of breath. Gastrointestinal: No abdominal pain.  No nausea, no vomiting.  No diarrhea.  No constipation. Genitourinary: Negative for dysuria. Musculoskeletal: Negative for back pain. Skin: Negative for rash. Neurological: Negative for headaches, focal weakness or numbness.  ____________________________________________   PHYSICAL EXAM:  VITAL SIGNS: ED Triage Vitals  Enc Vitals Group     BP 10/01/17 1540 (!) 145/70     Pulse Rate 10/01/17 1540 85     Resp 10/01/17 1540 18     Temp 10/01/17 1540 98.1 F (36.7 C)     Temp Source 10/01/17 1540 Oral     SpO2 10/01/17 1540 100 %     Weight 10/01/17 1538 200 lb (90.7 kg)     Height 10/01/17 1538 5\' 7"  (1.702 m)     Head Circumference --      Peak Flow --      Pain Score 10/01/17 1538 2     Pain Loc --      Pain Edu? --      Excl. in [place]? --     Constitutional: Alert and oriented. Well appearing and in no acute distress. Eyes: Conjunctivae  are normal.  Head: Atraumatic.Mild tenderness to palpation bilateral frontal and nontender maxillary sinuses. No swelling. No erythema.   Ears: no erythema, normal TMs bilaterally.   Nose: Mild nasal congestion with bilateral nasal turbinate erythema and edema.   Mouth/Throat: Mucous membranes are moist.  Oropharynx non-erythematous.No tonsillar swelling or exudate.  Neck: No stridor.  No cervical spine tenderness to palpation. Hematological/Lymphatic/Immunilogical: No cervical lymphadenopathy. Cardiovascular: Normal rate, regular rhythm. Grossly normal heart sounds.  Good peripheral circulation. Respiratory:  Normal respiratory effort.  No retractions.o wheezes, rales or rhonchi. Good air movement.  Musculoskeletal: Steady gait. No cervical, thoracic or lumbar tenderness to palpation.  Neurologic:  Normal speech and language. No gross focal neurologic deficits are appreciated. No gait instability. No paresthesias. Bilateral hand grips equal.  Skin:  Skin is warm, dry and intact. No rash noted. Psychiatric: Mood and affect are normal. Speech and behavior are normal.  ___________________________________________   LABS (all labs ordered are listed, but only abnormal results are displayed)  Labs Reviewed - No data to display ____________________________________________   PROCEDURES Procedures   INITIAL IMPRESSION / ASSESSMENT AND PLAN / ED COURSE  Pertinent labs & imaging results that were available during my care of the patient were reviewed by me and considered in my medical decision making (see chart for details).  Well-appearing patient.  No acute distress.  Suspect frontal sinusitis.  Will treat patient with oral Augmentin and Flonase.  Encourage rest, fluids, supportive care, and discussed strict follow-up and return parameters.Discussed indication, risks and benefits of medications with patient.  Discussed follow up with Primary care physician this week. Discussed follow up and return parameters including no resolution or any worsening concerns. Patient verbalized understanding and agreed to plan.   ____________________________________________   FINAL CLINICAL IMPRESSION(S) / ED DIAGNOSES  Final diagnoses:  Acute frontal sinusitis, recurrence not specified     ED Discharge Orders        Ordered    amoxicillin-clavulanate (AUGMENTIN) 875-125 MG tablet  Every 12 hours     10/01/17 1601    fluticasone (FLONASE) 50 MCG/ACT nasal spray  Daily     10/01/17 1601       Note: This dictation was prepared with Dragon dictation along with smaller phrase technology. Any  transcriptional errors that result from this process are unintentional.         Marylene Land, NP 10/01/17 1639

## 2017-10-01 NOTE — Discharge Instructions (Signed)
Take medication as prescribed.  Rest.  Drink plenty of fluids.  Monitor self.  Follow-up with your primary care physician this week as needed.  Return to urgent care as needed.

## 2018-02-16 ENCOUNTER — Other Ambulatory Visit: Payer: Self-pay

## 2018-02-16 ENCOUNTER — Encounter: Payer: Self-pay | Admitting: Emergency Medicine

## 2018-02-16 ENCOUNTER — Ambulatory Visit
Admission: EM | Admit: 2018-02-16 | Discharge: 2018-02-16 | Disposition: A | Discharge status: Home / Self Care | Payer: Medicare HMO

## 2018-02-16 DIAGNOSIS — J321 Chronic frontal sinusitis: Secondary | ICD-10-CM | POA: Diagnosis not present

## 2018-02-16 MED ORDER — AMOXICILLIN-POT CLAVULANATE 875-125 MG PO TABS: 1.0000 | ORAL_TABLET | Freq: Two times a day (BID) | ORAL | 0 refills | Status: AC

## 2018-02-16 NOTE — Discharge Instructions (Addendum)
Take antibiotics as prescribed. Stop Flonase. Start Nasacort 2 sprays per nostril daily. May also take Mucinex-D twice daily. Both medications are available over-the-counter but you must ask the pharmacist and show ID for the Mucinex-D. Continue Zyrtec daily. Drink plenty of fluids. Follow-up with your primary care provider as needed.

## 2018-02-16 NOTE — ED Triage Notes (Signed)
Patient in today c/o headache off & on x 2 weeks. Now c/o sinus congestion, pain and productive cough (yellow). Patient has had chills, but hasn't taken temperature. Patient has tried OTC Flonase and Zyrtec.

## 2018-02-16 NOTE — ED Provider Notes (Signed)
[facility]    CSN: [id number] Arrival date & time: 02/16/18  1340     History   Chief Complaint Chief Complaint  Patient presents with  . Cough    HPI Norma Snyder is a 72 y.o. female.   Subjective:   Norma Snyder is a 72 y.o. female who presents for evaluation of possible sinus infection. Symptoms include congestion, headache, nasal congestion, post nasal drip, productive cough with yellow and green colored sputum and sinus pressure with no fever, chills, night sweats or weight loss. Onset of symptoms was 2 weeks ago and has been unchanged since that time. She has taken one zyrtec which wasn't helpful in symptoms as well as tried Flonase a couple of times. She has not tried anything else for her symptoms. She is drinking plenty of fluids.  Past history is significant for no history of pneumonia or bronchitis. Patient is a non-smoker.  The following portions of the patient's history were reviewed and updated as appropriate: allergies, current medications, past family history, past medical history, past social history, past surgical history and problem list.       Past Medical History:  Diagnosis Date  . Cancer ([place])   . History of chemotherapy   . History of radiation therapy   . Hypertension     There are no active problems to display for this patient.   Past Surgical History:  Procedure Laterality Date  . ABDOMINAL HYSTERECTOMY    . MASTECTOMY Left     OB History   None      Home Medications    Prior to Admission medications   Medication Sig Start Date End Date Taking? Authorizing Provider  aspirin 81 MG tablet Take 81 mg by mouth daily.   Yes [provider]  cetirizine (ZYRTEC) 10 MG tablet Take 10 mg by mouth daily.   Yes [provider]  amoxicillin-clavulanate (AUGMENTIN) 875-125 MG tablet Take 1 tablet by mouth every 12 (twelve) hours. 02/16/18   Enrique Sack, FNP    Family History Family History    Problem Relation Age of Onset  . Other Mother        medical history unknown  . Other Father        unknown medial history    Social History Social History   Tobacco Use  . Smoking status: Former Smoker    Last attempt to quit: 02/17/1968    Years since quitting: 50.0  . Smokeless tobacco: Never Used  . Tobacco comment: light smoker  Substance Use Topics  . Alcohol use: No  . Drug use: No     Allergies   Patient has no known allergies.   Review of Systems Review of Systems  Constitutional: Negative for chills, fatigue and unexpected weight change.  HENT: Positive for congestion, postnasal drip, rhinorrhea and sinus pressure. Negative for sore throat.   Respiratory: Positive for cough. Negative for shortness of breath and wheezing.   Cardiovascular: Negative for chest pain.  Neurological: Positive for headaches.  All other systems reviewed and are negative.    Physical Exam Triage Vital Signs ED Triage Vitals [02/16/18 1400]  Enc Vitals Group     BP 125/72     Pulse Rate 82     Resp 16     Temp 98.5 F (36.9 C)     Temp Source Oral     SpO2 99 %     Weight 196 lb (88.9 kg)     Height 5'  7.5" (1.715 m)     Head Circumference      Peak Flow      Pain Score 0     Pain Loc      Pain Edu?      Excl. in [place]?    No data found.  Updated Vital Signs BP 125/72 (BP Location: Right Arm)   Pulse 82   Temp 98.5 F (36.9 C) (Oral)   Resp 16   Ht 5' 7.5" (1.715 m)   Wt 196 lb (88.9 kg)   SpO2 99%   BMI 30.24 kg/m   Visual Acuity Right Eye Distance:   Left Eye Distance:   Bilateral Distance:    Right Eye Near:   Left Eye Near:    Bilateral Near:     Physical Exam  Constitutional: She is oriented to person, place, and time. She appears well-developed and well-nourished. No distress.  HENT:  Head: Normocephalic and atraumatic.  Right Ear: Hearing, tympanic membrane, external ear and ear canal normal.  Left Ear: Hearing, tympanic membrane, external ear  and ear canal normal.  Nose: Right sinus exhibits frontal sinus tenderness. Right sinus exhibits no maxillary sinus tenderness. Left sinus exhibits frontal sinus tenderness. Left sinus exhibits no maxillary sinus tenderness.  Mouth/Throat: Uvula is midline, oropharynx is clear and moist and mucous membranes are normal. No oropharyngeal exudate.  Eyes: Pupils are equal, round, and reactive to light. Conjunctivae and EOM are normal. Right eye exhibits no discharge. Left eye exhibits no discharge.  Neck: Normal range of motion. Neck supple.  Cardiovascular: Normal rate, regular rhythm and normal heart sounds.  Pulmonary/Chest: Effort normal and breath sounds normal.  Musculoskeletal: Normal range of motion.  Lymphadenopathy:    She has no cervical adenopathy.  Neurological: She is alert and oriented to person, place, and time.  Skin: Skin is warm and dry.  Psychiatric: She has a normal mood and affect.     UC Treatments / Results  Labs (all labs ordered are listed, but only abnormal results are displayed) Labs Reviewed - No data to display  EKG None  Radiology No results found.  Procedures Procedures (including critical care time)  Medications Ordered in UC Medications - No data to display  Initial Impression / Assessment and Plan / UC Course  I have reviewed the triage vital signs and the nursing notes.  Pertinent labs & imaging results that were available during my care of the patient were reviewed by me and considered in my medical decision making (see chart for details).    72 year old female presenting with a two-week history of congestion, headache, nasal congestion, post nasal drip, productive cough with yellow and green colored sputum and sinus pressure with no fever, chills, night sweats or weight loss.  Patient is nontoxic-appearing.  Afebrile.  Vital signs stable.  Physical exam reveals frontal sinus tenderness, otherwise unremarkable.   Plan:   1. Take Augmentin as  prescribed. 2. Stop Flonase; start Nasacort 2 sprays per nostril daily.  3. May take Mucinex-D twice daily.  4. Continue Zyrtec daily.  5. Drink plenty of fluids.  6. Follow-up with your primary care provider as needed.   Evaluation has revealed no signs of a dangerous process. Discussed diagnosis with patient. Patient aware of their illness, possible red flag symptoms to watch out for and need for close follow up. Patient understands verbal and written discharge instructions. Patient comfortable with plan and disposition.  Patient a clear mental status at this time, good insight  into illness (after discussion and teaching) and has clear judgment to make decisions regarding their care.  Documentation was completed with the aid of voice recognition software. Transcription may contain typographical errors.  Final Clinical Impressions(s) / UC Diagnoses   Final diagnoses:  Chronic frontal sinusitis     Discharge Instructions     Take antibiotics as prescribed. Stop Flonase. Start Nasacort 2 sprays per nostril daily. May also take Mucinex-D twice daily. Both medications are available over-the-counter but you must ask the pharmacist and show ID for the Mucinex-D. Continue Zyrtec daily. Drink plenty of fluids. Follow-up with your primary care provider as needed.    ED Prescriptions    Medication Sig Dispense Auth. Provider   amoxicillin-clavulanate (AUGMENTIN) 875-125 MG tablet Take 1 tablet by mouth every 12 (twelve) hours. 14 tablet Enrique Sack, FNP     Controlled Substance Prescriptions [place] Controlled Substance Registry consulted? Not Applicable   Enrique Sack, [facility] 02/16/18 1515

## 2018-12-31 ENCOUNTER — Other Ambulatory Visit: Payer: Self-pay

## 2018-12-31 ENCOUNTER — Emergency Department
Admission: EM | Admit: 2018-12-31 | Discharge: 2018-12-31 | Disposition: A | Discharge status: Home / Self Care | Payer: Medicare HMO | Attending: Emergency Medicine | Admitting: Emergency Medicine

## 2018-12-31 ENCOUNTER — Encounter: Payer: Self-pay | Admitting: Emergency Medicine

## 2018-12-31 DIAGNOSIS — Z79899 Other long term (current) drug therapy: Secondary | ICD-10-CM | POA: Diagnosis not present

## 2018-12-31 DIAGNOSIS — Z923 Personal history of irradiation: Secondary | ICD-10-CM | POA: Diagnosis not present

## 2018-12-31 DIAGNOSIS — Z9221 Personal history of antineoplastic chemotherapy: Secondary | ICD-10-CM | POA: Diagnosis not present

## 2018-12-31 DIAGNOSIS — R0981 Nasal congestion: Secondary | ICD-10-CM | POA: Diagnosis present

## 2018-12-31 DIAGNOSIS — I10 Essential (primary) hypertension: Secondary | ICD-10-CM | POA: Insufficient documentation

## 2018-12-31 DIAGNOSIS — Z7982 Long term (current) use of aspirin: Secondary | ICD-10-CM | POA: Diagnosis not present

## 2018-12-31 DIAGNOSIS — Z9012 Acquired absence of left breast and nipple: Secondary | ICD-10-CM | POA: Diagnosis not present

## 2018-12-31 DIAGNOSIS — Z87891 Personal history of nicotine dependence: Secondary | ICD-10-CM | POA: Insufficient documentation

## 2018-12-31 DIAGNOSIS — R030 Elevated blood-pressure reading, without diagnosis of hypertension: Secondary | ICD-10-CM

## 2018-12-31 NOTE — ED Notes (Signed)
Pt was given discharge paperwork by this RN and stated, "so they aren't going to check it anymore?" This RN informed her that her blood pressure was stable and that she was able to be discharged home and to follow up with her PCP. Pt took paperwork and walked out of the room. Pt left without signing discharge form.

## 2018-12-31 NOTE — Discharge Instructions (Addendum)
If you have chest pain, shortness of breath, you change your mind about further work-up or you feel worse in any way return to the emergency room.  Otherwise please follow-up closely with your doctor in the next few days.

## 2018-12-31 NOTE — ED Triage Notes (Signed)
Had bp checked and it was 155/91.  Says she used to be on med, but not in a long time.  Wants it checked out.

## 2018-12-31 NOTE — ED Provider Notes (Signed)
[facility] Emergency Department Provider Note  ____________________________________________   I have reviewed the triage vital signs and the nursing notes. Where available I have reviewed prior notes and, if possible and indicated, outside hospital notes.    HISTORY  Chief Complaint Hypertension and Dizziness    HPI Norma Snyder is a 73 y.o. female  who is remarkably healthy does have a history of hypertension but no longer is on medications, patient's blood pressure by prior notes he is usually between 10/13/1945, she states that she went to the urgent care today because when she goes outside during allergy season, she her nose feels stuffed up and she wanted to make sure that was all that was going on.  She had no headache no stiff neck no no numbness no weakness, no chest pain no shortness of breath no exertional symptoms she feels 100% fine except for when she is outside and the pollen gets in her nose it bothers her.  She is supposed to be taking Flonase but is not taking it.  At the minor care blood pressure was found to be in the 150s and this made her concerned so she came here now it is even higher.  However, patient is also very worried about possibly picking up coronavirus in the ER and she does not want any kind of extensive work-up.  She just wants me to "check her out".   Past Medical History:  Diagnosis Date  . Cancer ([place])   . History of chemotherapy   . History of radiation therapy   . Hypertension     There are no active problems to display for this patient.   Past Surgical History:  Procedure Laterality Date  . ABDOMINAL HYSTERECTOMY    . MASTECTOMY Left     Prior to Admission medications   Medication Sig Start Date End Date Taking? Authorizing Provider  amoxicillin-clavulanate (AUGMENTIN) 875-125 MG tablet Take 1 tablet by mouth every 12 (twelve) hours. 02/16/18   Enrique Sack, FNP  aspirin 81 MG tablet Take 81 mg by mouth  daily.    [provider]  cetirizine (ZYRTEC) 10 MG tablet Take 10 mg by mouth daily.    [provider]    Allergies Patient has no known allergies.  Family History  Problem Relation Age of Onset  . Other Mother        medical history unknown  . Other Father        unknown medial history    Social History Social History   Tobacco Use  . Smoking status: Former Smoker    Last attempt to quit: 02/17/1968    Years since quitting: 50.9  . Smokeless tobacco: Never Used  . Tobacco comment: light smoker  Substance Use Topics  . Alcohol use: No  . Drug use: No    Review of Systems Constitutional: No fever/chills Eyes: No visual changes. ENT: No sore throat. No stiff neck no neck pain Cardiovascular: Denies chest pain. Respiratory: Denies shortness of breath. Gastrointestinal:   no vomiting.  No diarrhea.  No constipation. Genitourinary: Negative for dysuria. Musculoskeletal: Negative lower extremity swelling Skin: Negative for rash. Neurological: Negative for severe headaches, focal weakness or numbness.   ____________________________________________   PHYSICAL EXAM:  VITAL SIGNS: ED Triage Vitals [12/31/18 1246]  Enc Vitals Group     BP (!) 180/69     Pulse Rate 89     Resp 14     Temp 98 F (36.7 C)  Temp Source Oral     SpO2 100 %     Weight 190 lb (86.2 kg)     Height 5\' 7"  (1.702 m)     Head Circumference      Peak Flow      Pain Score 0     Pain Loc      Pain Edu?      Excl. in [place]?     Constitutional: Alert and oriented. Well appearing and in no acute distress. Eyes: Conjunctivae are normal Head: Atraumatic HEENT: No congestion/rhinnorhea. Mucous membranes are moist.  Oropharynx non-erythematous Neck:   Nontender with no meningismus, no masses, no stridor Cardiovascular: Normal rate, regular rhythm. Grossly normal heart sounds.  Good peripheral circulation. Respiratory: Normal respiratory effort.  No retractions. Lungs  CTAB. Abdominal: Soft and nontender. No distention. No guarding no rebound Back:  There is no focal tenderness or step off.  there is no midline tenderness there are no lesions noted. there is no CVA tenderness Musculoskeletal: No lower extremity tenderness, no upper extremity tenderness. No joint effusions, no DVT signs strong distal pulses no edema Neurologic:  Normal speech and language. No gross focal neurologic deficits are appreciated.  Skin:  Skin is warm, dry and intact. No rash noted. Psychiatric: Mood and affect are anxious. Speech and behavior are normal.  ____________________________________________   LABS (all labs ordered are listed, but only abnormal results are displayed)  Labs Reviewed - No data to display  Pertinent labs  results that were available during my care of the patient were reviewed by me and considered in my medical decision making (see chart for details). ____________________________________________  EKG  I personally interpreted any EKGs ordered by me or triage  ____________________________________________  RADIOLOGY  Pertinent labs & imaging results that were available during my care of the patient were reviewed by me and considered in my medical decision making (see chart for details). If possible, patient and/or family made aware of any abnormal findings.  No results found. ____________________________________________    PROCEDURES  Procedure(s) performed: None  Procedures  Critical Care performed: None  ____________________________________________   INITIAL IMPRESSION / ASSESSMENT AND PLAN / ED COURSE  Pertinent labs & imaging results that were available during my care of the patient were reviewed by me and considered in my medical decision making (see chart for details).  Patient here with asymptomatic hypertension, likely exacerbated by her anxiety about her hypertension and her anxiety about being here.  We had an extensive talk  about the appropriate ER management per the [employer] of emergency physicians for asymptomatic hypertension, which I believe this to be, and she and does not want any work-up which I do not think is unreasonable.  I did offer to do an EKG and check her kidney function etc. and she declines.  She wants to be out of the department as fast as possible because she is worried about the coronavirus.  Given that she is 17 this is certainly not an unreasonable fear.  Given that she has no symptoms of any variety, nothing consistent with endorgan damage clinically to the extent that she is willing to let me check, I do not see any reason to keep her here for asymptomatic hypertension we will have her follow closely with her primary care doctor.  Patient is very reassured by her exam and she is eager to be discharged.  I have encouraged her to go back on her Zyrtec and her Flonase.  I have also rechecked  her blood pressure here and is back down in the 222L which certainly is I think a reasonable thing for her to go home with.  She will follow closely with her doctor on Monday.  She is very satisfied with this visit and she has no further complaints.  Return precautions and follow-up given and understood.    ____________________________________________   FINAL CLINICAL IMPRESSION(S) / ED DIAGNOSES  Final diagnoses:  None      This chart was dictated using voice recognition software.  Despite best efforts to proofread,  errors can occur which can change meaning.      Schuyler Amor, MD 12/31/18 1316

## 2019-06-28 ENCOUNTER — Other Ambulatory Visit: Payer: Self-pay

## 2019-06-28 DIAGNOSIS — Z20822 Contact with and (suspected) exposure to covid-19: Secondary | ICD-10-CM

## 2019-06-30 LAB — NOVEL CORONAVIRUS, NAA: SARS-CoV-2, NAA: NOT DETECTED

## 2019-10-28 ENCOUNTER — Ambulatory Visit: Payer: Medicare HMO | Attending: Internal Medicine

## 2019-10-28 DIAGNOSIS — Z23 Encounter for immunization: Secondary | ICD-10-CM | POA: Insufficient documentation

## 2019-10-28 NOTE — Progress Notes (Signed)
   Covid-19 Vaccination Clinic  Name:  Norma Snyder    MRN: [id number] DOB: 04/11/1946  10/28/2019  Norma Snyder was observed post Covid-19 immunization for 15 minutes without incidence. She was provided with Vaccine Information Sheet and instruction to access the V-Safe system.   Norma Snyder was instructed to call 911 with any severe reactions post vaccine: Marland Kitchen Difficulty breathing  . Swelling of your face and throat  . A fast heartbeat  . A bad rash all over your body  . Dizziness and weakness    Immunizations Administered    Name Date Dose VIS Date Route   Moderna COVID-19 Vaccine 10/28/2019  2:45 PM 0.5 mL 08/23/2019 Intramuscular   Manufacturer: Moderna   Lot: [id number]   [id number]

## 2019-11-29 ENCOUNTER — Ambulatory Visit: Payer: Medicare HMO | Attending: Internal Medicine

## 2019-11-29 DIAGNOSIS — Z23 Encounter for immunization: Secondary | ICD-10-CM

## 2019-11-29 NOTE — Progress Notes (Signed)
   Covid-19 Vaccination Clinic  Name:  Norma Snyder    MRN: [id number] DOB: January 02, 1946  11/29/2019  Ms. Norma Snyder was observed post Covid-19 immunization for 15 minutes without incident. She was provided with Vaccine Information Sheet and instruction to access the V-Safe system.   Ms. Norma Snyder was instructed to call 911 with any severe reactions post vaccine: Marland Kitchen Difficulty breathing  . Swelling of face and throat  . A fast heartbeat  . A bad rash all over body  . Dizziness and weakness   Immunizations Administered    Name Date Dose VIS Date Route   Moderna COVID-19 Vaccine 11/29/2019  1:28 PM 0.5 mL 08/23/2019 Intramuscular   Manufacturer: Moderna   Lot: [id number]   [id number]

## 2020-09-24 ENCOUNTER — Other Ambulatory Visit: Payer: Medicaid Other

## 2020-09-24 DIAGNOSIS — Z20822 Contact with and (suspected) exposure to covid-19: Secondary | ICD-10-CM

## 2020-09-26 LAB — NOVEL CORONAVIRUS, NAA: SARS-CoV-2, NAA: NOT DETECTED

## 2020-09-26 LAB — SARS-COV-2, NAA 2 DAY TAT

## 2021-04-26 DIAGNOSIS — Z111 Encounter for screening for respiratory tuberculosis: Secondary | ICD-10-CM | POA: Diagnosis not present

## 2021-04-26 DIAGNOSIS — H6123 Impacted cerumen, bilateral: Secondary | ICD-10-CM | POA: Diagnosis not present

## 2021-04-26 DIAGNOSIS — R519 Headache, unspecified: Secondary | ICD-10-CM | POA: Diagnosis not present

## 2021-04-26 DIAGNOSIS — Z23 Encounter for immunization: Secondary | ICD-10-CM | POA: Diagnosis not present

## 2021-05-16 DIAGNOSIS — Z1231 Encounter for screening mammogram for malignant neoplasm of breast: Secondary | ICD-10-CM | POA: Diagnosis not present

## 2021-09-30 DIAGNOSIS — H25813 Combined forms of age-related cataract, bilateral: Secondary | ICD-10-CM | POA: Diagnosis not present

## 2021-09-30 DIAGNOSIS — H524 Presbyopia: Secondary | ICD-10-CM | POA: Diagnosis not present

## 2022-01-03 DIAGNOSIS — J302 Other seasonal allergic rhinitis: Secondary | ICD-10-CM | POA: Diagnosis not present

## 2022-01-03 DIAGNOSIS — R519 Headache, unspecified: Secondary | ICD-10-CM | POA: Diagnosis not present

## 2022-01-03 DIAGNOSIS — J3489 Other specified disorders of nose and nasal sinuses: Secondary | ICD-10-CM | POA: Diagnosis not present

## 2022-01-03 DIAGNOSIS — Z23 Encounter for immunization: Secondary | ICD-10-CM | POA: Diagnosis not present

## 2022-01-03 DIAGNOSIS — I10 Essential (primary) hypertension: Secondary | ICD-10-CM | POA: Diagnosis not present

## 2022-01-24 DIAGNOSIS — N393 Stress incontinence (female) (male): Secondary | ICD-10-CM | POA: Diagnosis not present

## 2022-01-24 DIAGNOSIS — Z780 Asymptomatic menopausal state: Secondary | ICD-10-CM | POA: Diagnosis not present

## 2022-01-24 DIAGNOSIS — I10 Essential (primary) hypertension: Secondary | ICD-10-CM | POA: Diagnosis not present

## 2022-01-24 DIAGNOSIS — E785 Hyperlipidemia, unspecified: Secondary | ICD-10-CM | POA: Diagnosis not present

## 2022-02-20 DIAGNOSIS — Z780 Asymptomatic menopausal state: Secondary | ICD-10-CM | POA: Diagnosis not present

## 2022-03-05 DIAGNOSIS — H2511 Age-related nuclear cataract, right eye: Secondary | ICD-10-CM | POA: Diagnosis not present

## 2022-03-05 DIAGNOSIS — H2513 Age-related nuclear cataract, bilateral: Secondary | ICD-10-CM | POA: Diagnosis not present

## 2022-03-05 DIAGNOSIS — H2512 Age-related nuclear cataract, left eye: Secondary | ICD-10-CM | POA: Diagnosis not present

## 2022-03-10 DIAGNOSIS — E785 Hyperlipidemia, unspecified: Secondary | ICD-10-CM | POA: Diagnosis not present

## 2022-03-12 DIAGNOSIS — H2511 Age-related nuclear cataract, right eye: Secondary | ICD-10-CM | POA: Diagnosis not present

## 2022-05-21 DIAGNOSIS — Z0100 Encounter for examination of eyes and vision without abnormal findings: Secondary | ICD-10-CM | POA: Diagnosis not present

## 2022-06-01 DIAGNOSIS — Z79899 Other long term (current) drug therapy: Secondary | ICD-10-CM | POA: Diagnosis not present

## 2022-06-01 DIAGNOSIS — I10 Essential (primary) hypertension: Secondary | ICD-10-CM | POA: Diagnosis not present

## 2022-06-01 DIAGNOSIS — J019 Acute sinusitis, unspecified: Secondary | ICD-10-CM | POA: Diagnosis not present

## 2022-06-01 DIAGNOSIS — E785 Hyperlipidemia, unspecified: Secondary | ICD-10-CM | POA: Diagnosis not present

## 2022-06-01 DIAGNOSIS — R059 Cough, unspecified: Secondary | ICD-10-CM | POA: Diagnosis not present

## 2022-06-01 DIAGNOSIS — Z87891 Personal history of nicotine dependence: Secondary | ICD-10-CM | POA: Diagnosis not present

## 2022-06-01 DIAGNOSIS — R0981 Nasal congestion: Secondary | ICD-10-CM | POA: Diagnosis not present

## 2022-06-01 DIAGNOSIS — R0600 Dyspnea, unspecified: Secondary | ICD-10-CM | POA: Diagnosis not present

## 2022-06-11 DIAGNOSIS — R059 Cough, unspecified: Secondary | ICD-10-CM | POA: Diagnosis not present

## 2022-07-08 DIAGNOSIS — Z1231 Encounter for screening mammogram for malignant neoplasm of breast: Secondary | ICD-10-CM | POA: Diagnosis not present

## 2022-11-05 DIAGNOSIS — C50912 Malignant neoplasm of unspecified site of left female breast: Secondary | ICD-10-CM | POA: Diagnosis not present

## 2022-11-05 DIAGNOSIS — Z23 Encounter for immunization: Secondary | ICD-10-CM | POA: Diagnosis not present

## 2022-11-05 DIAGNOSIS — I10 Essential (primary) hypertension: Secondary | ICD-10-CM | POA: Diagnosis not present

## 2022-11-05 DIAGNOSIS — Z111 Encounter for screening for respiratory tuberculosis: Secondary | ICD-10-CM | POA: Diagnosis not present

## 2022-11-05 DIAGNOSIS — E785 Hyperlipidemia, unspecified: Secondary | ICD-10-CM | POA: Diagnosis not present

## 2022-11-05 DIAGNOSIS — C50911 Malignant neoplasm of unspecified site of right female breast: Secondary | ICD-10-CM | POA: Diagnosis not present

## 2022-11-05 DIAGNOSIS — R29898 Other symptoms and signs involving the musculoskeletal system: Secondary | ICD-10-CM | POA: Diagnosis not present

## 2023-01-02 ENCOUNTER — Other Ambulatory Visit: Payer: Medicare PPO

## 2024-05-23 ENCOUNTER — Emergency Department

## 2024-05-23 ENCOUNTER — Emergency Department
Admission: EM | Admit: 2024-05-23 | Discharge: 2024-05-23 | Disposition: A | Discharge status: Home / Self Care | Attending: Emergency Medicine | Admitting: Emergency Medicine

## 2024-05-23 ENCOUNTER — Other Ambulatory Visit: Payer: Self-pay

## 2024-05-23 DIAGNOSIS — R0789 Other chest pain: Secondary | ICD-10-CM | POA: Insufficient documentation

## 2024-05-23 DIAGNOSIS — Y92410 Unspecified street and highway as the place of occurrence of the external cause: Secondary | ICD-10-CM | POA: Diagnosis not present

## 2024-05-23 DIAGNOSIS — M25561 Pain in right knee: Secondary | ICD-10-CM | POA: Insufficient documentation

## 2024-05-23 DIAGNOSIS — I10 Essential (primary) hypertension: Secondary | ICD-10-CM | POA: Diagnosis not present

## 2024-05-23 LAB — CBC WITH DIFFERENTIAL/PLATELET
Abs Immature Granulocytes: 0.03 K/uL (ref 0.00–0.07)
Basophils Absolute: 0 K/uL (ref 0.0–0.1)
Basophils Relative: 0 %
Eosinophils Absolute: 0 K/uL (ref 0.0–0.5)
Eosinophils Relative: 0 %
HCT: 35.7 % — ABNORMAL LOW (ref 36.0–46.0)
Hemoglobin: 11.7 g/dL — ABNORMAL LOW (ref 12.0–15.0)
Immature Granulocytes: 0 %
Lymphocytes Relative: 12 %
Lymphs Abs: 0.9 K/uL (ref 0.7–4.0)
MCH: 31.3 pg (ref 26.0–34.0)
MCHC: 32.8 g/dL (ref 30.0–36.0)
MCV: 95.5 fL (ref 80.0–100.0)
Monocytes Absolute: 0.3 K/uL (ref 0.1–1.0)
Monocytes Relative: 5 %
Neutro Abs: 6.1 K/uL (ref 1.7–7.7)
Neutrophils Relative %: 83 %
Platelets: 196 K/uL (ref 150–400)
RBC: 3.74 MIL/uL — ABNORMAL LOW (ref 3.87–5.11)
RDW: 13.7 % (ref 11.5–15.5)
WBC: 7.3 K/uL (ref 4.0–10.5)
nRBC: 0 % (ref 0.0–0.2)

## 2024-05-23 LAB — COMPREHENSIVE METABOLIC PANEL WITH GFR
ALT: 18 U/L (ref 0–44)
AST: 25 U/L (ref 15–41)
Albumin: 4.5 g/dL (ref 3.5–5.0)
Alkaline Phosphatase: 67 U/L (ref 38–126)
Anion gap: 9 (ref 5–15)
BUN: 18 mg/dL (ref 8–23)
CO2: 25 mmol/L (ref 22–32)
Calcium: 9.6 mg/dL (ref 8.9–10.3)
Chloride: 103 mmol/L (ref 98–111)
Creatinine, Ser: 0.87 mg/dL (ref 0.44–1.00)
GFR, Estimated: >60 mL/min (ref >60)
Glucose, Bld: 181 mg/dL — ABNORMAL HIGH (ref 70–99)
Potassium: 3.6 mmol/L (ref 3.5–5.1)
Sodium: 137 mmol/L (ref 135–145)
Total Bilirubin: 0.7 mg/dL (ref 0.0–1.2)
Total Protein: 8.1 g/dL (ref 6.5–8.1)

## 2024-05-23 LAB — TROPONIN I (HIGH SENSITIVITY): Troponin I (High Sensitivity): 17 ng/L (ref <18)

## 2024-05-23 MED ORDER — LIDOCAINE 5 % EX PTCH: 1.0000 | MEDICATED_PATCH | Freq: Two times a day (BID) | CUTANEOUS | 1 refills | Status: AC

## 2024-05-23 MED ORDER — LIDOCAINE 5 % EX PTCH
1.0000 | MEDICATED_PATCH | CUTANEOUS | Status: DC
  Administered 2024-05-23: 1 via TRANSDERMAL
  Filled 2024-05-23: qty 1

## 2024-05-23 MED ORDER — ACETAMINOPHEN 500 MG PO TABS
1000.0000 mg | ORAL_TABLET | Freq: Once | ORAL | Status: AC
  Administered 2024-05-23: 1000 mg via ORAL
  Filled 2024-05-23: qty 2

## 2024-05-23 NOTE — Discharge Instructions (Signed)
Use Tylenol for pain and fevers.  Up to 1000 mg per dose, up to 4 times per day.  Do not take more than 4000 mg of Tylenol/acetaminophen within 24 hours.. ° °Please use lidocaine patches at your site of pain.  Apply 1 patch at a time, leave on for 12 hours, then remove for 12 hours.  12 hours on, 12 hours off.  Do not apply more than 1 patch at a time. ° °
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
